# Patient Record
Sex: Male | Born: 1980 | Race: Black or African American | Hispanic: No | Marital: Married | Smoking: Never smoker
Health system: Southern US, Community
[De-identification: ages and names within clinical notes are randomized; demographics above are authoritative.]

---

## 2008-01-26 ENCOUNTER — Ambulatory Visit: Payer: Self-pay | Admitting: Family Medicine

## 2010-04-13 ENCOUNTER — Ambulatory Visit: Payer: Self-pay | Admitting: Family Medicine

## 2011-03-01 ENCOUNTER — Ambulatory Visit: Payer: Self-pay | Admitting: Internal Medicine

## 2012-05-25 ENCOUNTER — Ambulatory Visit: Payer: Self-pay | Admitting: Internal Medicine

## 2012-06-13 ENCOUNTER — Ambulatory Visit: Payer: Self-pay | Admitting: Orthopedic Surgery

## 2013-11-06 IMAGING — CR DG SHOULDER 3+V*L*
1 series · 6 of 6 positions shown · non-contrast
Comparison: none

REASON FOR EXAM: injury
COMMENTS:

PROCEDURE:     MDR - MDR SHOULDER LEFT COMPLETE  - May 25, 2012  [DATE]
RESULT:     Images of the left shoulder demonstrate no definite fracture,
dislocation or radiopaque foreign body.

[Series 1: internal rotate · 0.17mm/px · 6 of 6 slices shown]
[im 1/6]
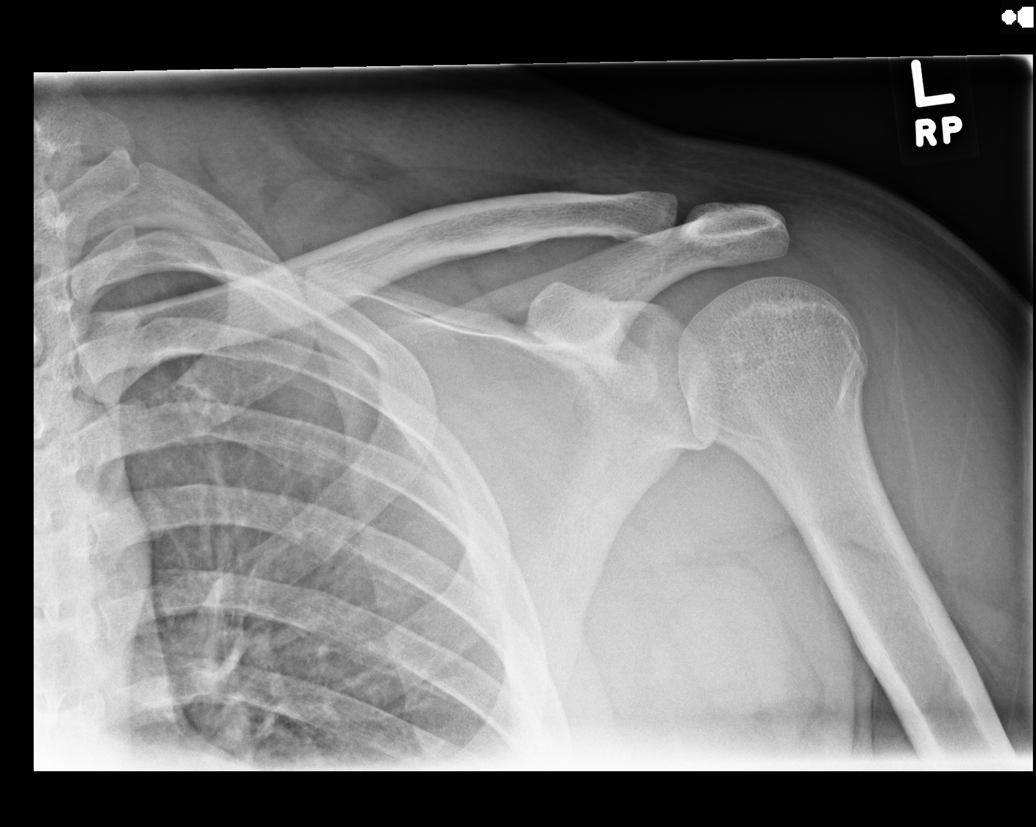
[im 2/6]
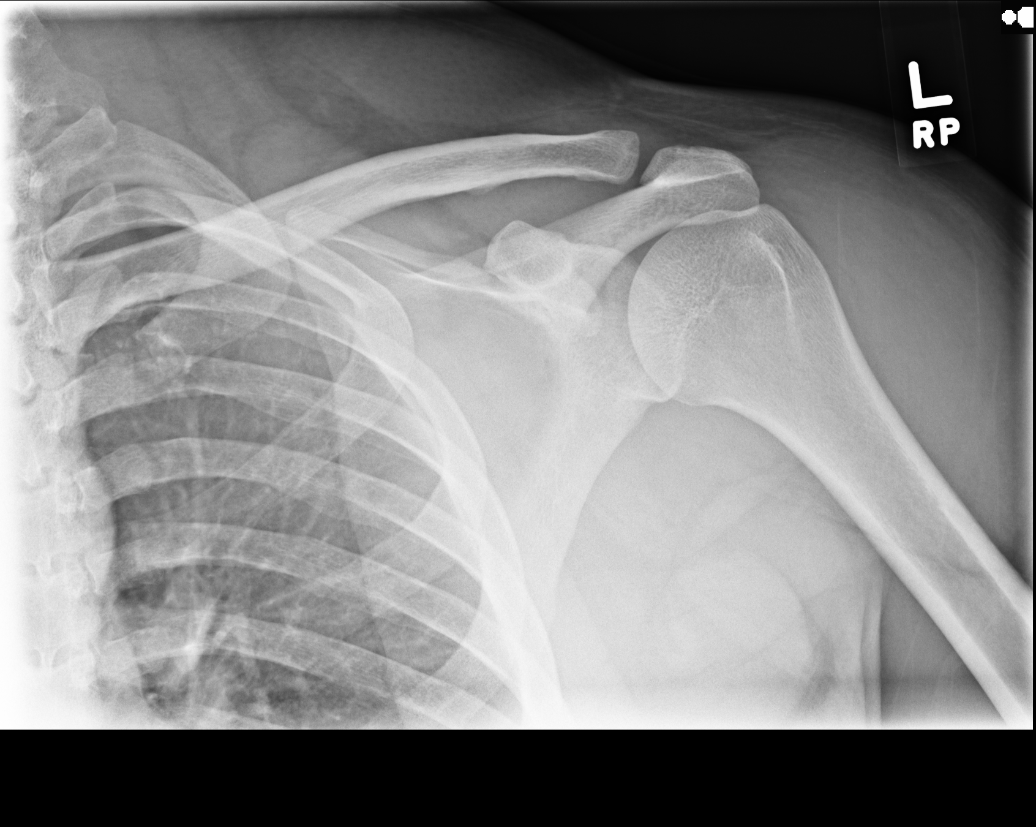
[im 3/6]
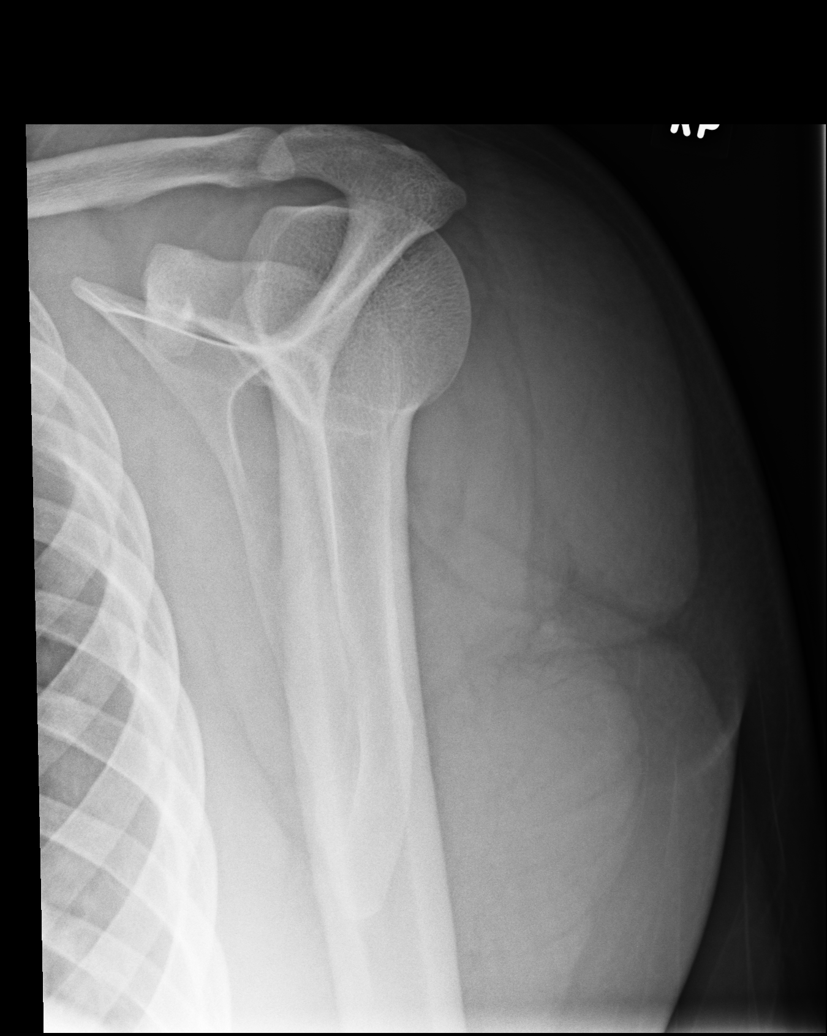
[im 4/6]
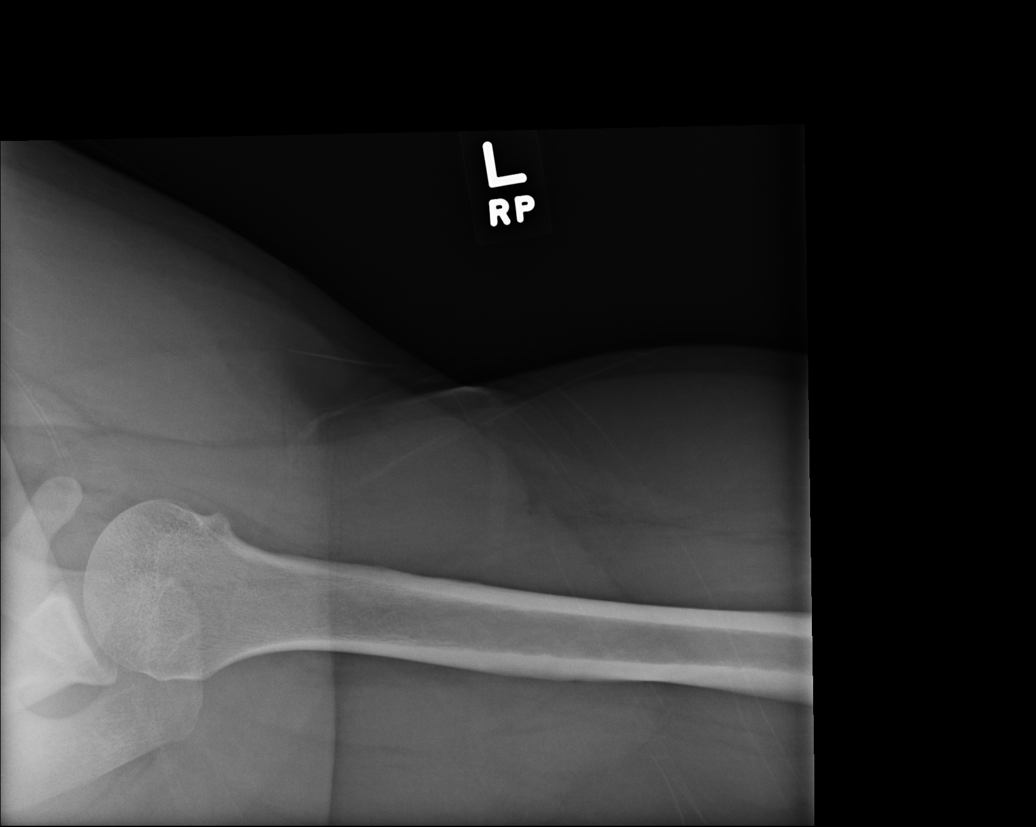
[im 5/6]
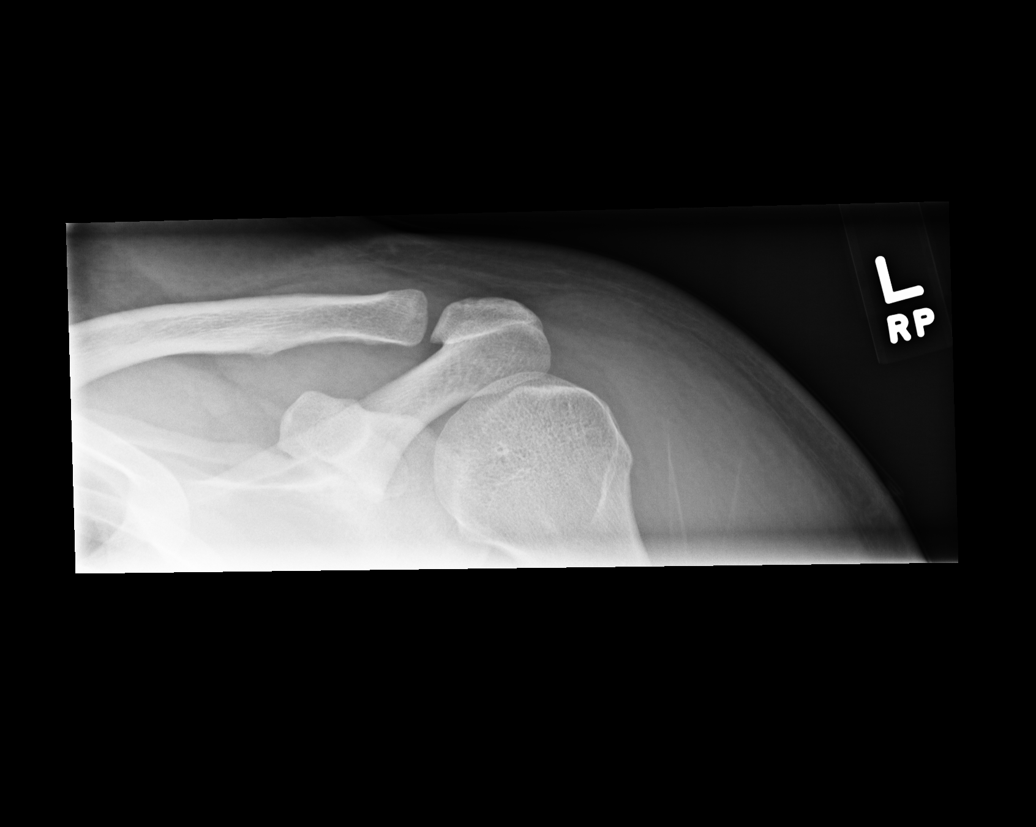
[im 6/6]
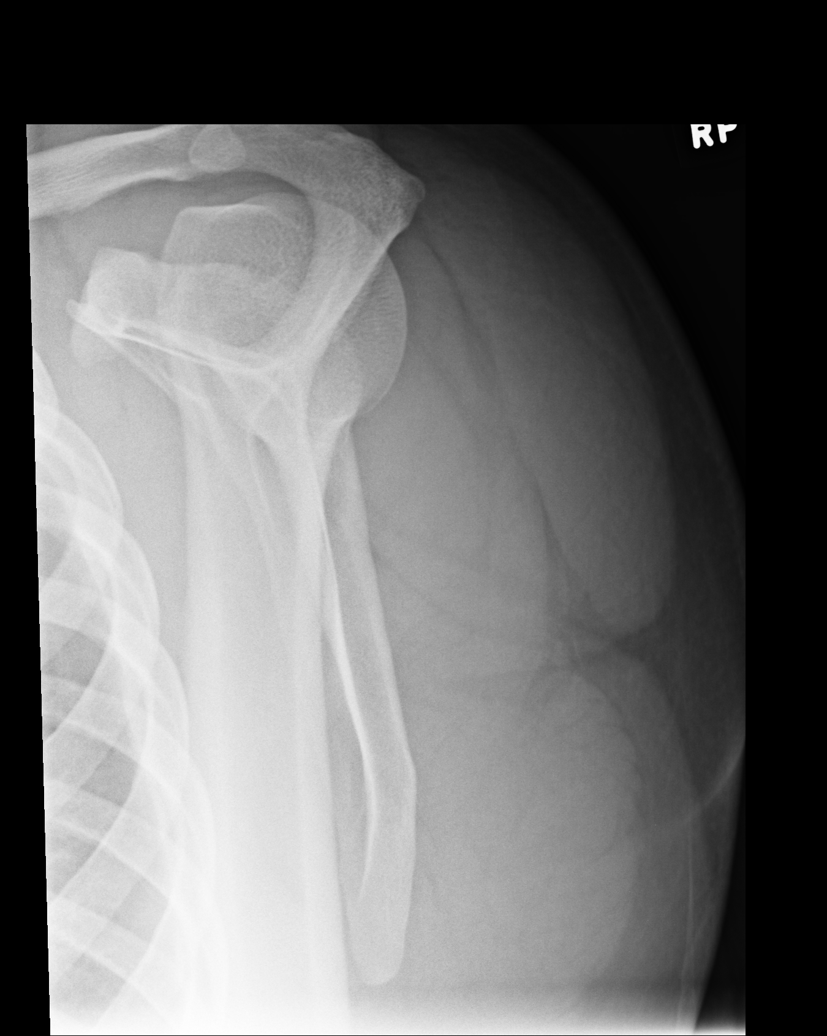

[6 of 6 positions shown; findings below may reference images not displayed]

IMPRESSION: Please see above.

[REDACTED]

## 2014-01-12 ENCOUNTER — Ambulatory Visit: Payer: Self-pay | Admitting: Family Medicine

## 2014-01-12 LAB — CBC WITH DIFFERENTIAL/PLATELET
BASOS PCT: 0.3 %
Basophil #: 0 10*3/uL (ref 0.0–0.1)
EOS PCT: 0.3 %
Eosinophil #: 0 10*3/uL (ref 0.0–0.7)
HCT: 47.5 % (ref 40.0–52.0)
HGB: 16.1 g/dL (ref 13.0–18.0)
LYMPHS PCT: 15.5 %
Lymphocyte #: 0.8 10*3/uL — ABNORMAL LOW (ref 1.0–3.6)
MCH: 28.7 pg (ref 26.0–34.0)
MCHC: 33.8 g/dL (ref 32.0–36.0)
MCV: 85 fL (ref 80–100)
MONOS PCT: 12.4 %
Monocyte #: 0.6 x10 3/mm (ref 0.2–1.0)
Neutrophil #: 3.8 10*3/uL (ref 1.4–6.5)
Neutrophil %: 71.5 %
PLATELETS: 170 10*3/uL (ref 150–440)
RBC: 5.6 10*6/uL (ref 4.40–5.90)
RDW: 14.9 % — ABNORMAL HIGH (ref 11.5–14.5)
WBC: 5.2 10*3/uL (ref 3.8–10.6)

## 2014-01-12 LAB — BASIC METABOLIC PANEL
Anion Gap: 10 (ref 7–16)
BUN: 14 mg/dL (ref 7–18)
CALCIUM: 8.2 mg/dL — AB (ref 8.5–10.1)
Chloride: 99 mmol/L (ref 98–107)
Co2: 28 mmol/L (ref 21–32)
Creatinine: 1.35 mg/dL — ABNORMAL HIGH (ref 0.60–1.30)
EGFR (Non-African Amer.): >60
GLUCOSE: 101 mg/dL — AB (ref 65–99)
Osmolality: 274 (ref 275–301)
Potassium: 3.9 mmol/L (ref 3.5–5.1)
SODIUM: 137 mmol/L (ref 136–145)

## 2018-12-01 ENCOUNTER — Ambulatory Visit
Admission: EM | Admit: 2018-12-01 | Discharge: 2018-12-01 | Disposition: A | Discharge status: Home / Self Care | Payer: BLUE CROSS/BLUE SHIELD | Attending: Family Medicine | Admitting: Family Medicine

## 2018-12-01 ENCOUNTER — Encounter: Payer: Self-pay | Admitting: Emergency Medicine

## 2018-12-01 ENCOUNTER — Other Ambulatory Visit: Payer: Self-pay

## 2018-12-01 DIAGNOSIS — R5383 Other fatigue: Secondary | ICD-10-CM | POA: Diagnosis not present

## 2018-12-01 DIAGNOSIS — K219 Gastro-esophageal reflux disease without esophagitis: Secondary | ICD-10-CM | POA: Diagnosis not present

## 2018-12-01 DIAGNOSIS — J111 Influenza due to unidentified influenza virus with other respiratory manifestations: Secondary | ICD-10-CM

## 2018-12-01 DIAGNOSIS — R05 Cough: Secondary | ICD-10-CM | POA: Diagnosis not present

## 2018-12-01 DIAGNOSIS — R69 Illness, unspecified: Secondary | ICD-10-CM | POA: Insufficient documentation

## 2018-12-01 MED ORDER — HYDROCOD POLST-CPM POLST ER 10-8 MG/5ML PO SUER: 5.0000 mL | Freq: Every evening | ORAL | 0 refills | Status: AC | PRN

## 2018-12-01 MED ORDER — OMEPRAZOLE 40 MG PO CPDR: 40.0000 mg | DELAYED_RELEASE_CAPSULE | Freq: Every day | ORAL | 0 refills | Status: AC

## 2018-12-01 MED ORDER — BENZONATATE 100 MG PO CAPS: 100.0000 mg | ORAL_CAPSULE | Freq: Three times a day (TID) | ORAL | 0 refills | Status: AC | PRN

## 2018-12-01 NOTE — Discharge Instructions (Signed)
Medication as prescribed.  Rest and lots of fluids.  Take care  Dr. Edythe Riches  

## 2018-12-01 NOTE — ED Triage Notes (Signed)
Patient c/o cough, headache and fever that started 6 days ago. Patient has been taking OTC Tylenol for his symptoms.

## 2018-12-01 NOTE — ED Provider Notes (Signed)
[facility]    CSN: [id number] Arrival date & time: 12/01/18  1413  History   Chief Complaint Chief Complaint  Patient presents with  . Fever  . Headache   HPI  37 year old male presents with the above complaints.  Patient reports that he was sick last week.  Developed fever, T-max 104 on Wednesday.  Persisted until Friday.  Then developed fatigue, cough.  Has had some recent stomach cramping as well as reflux as well.  No fever currently.  He has been taking Tylenol without resolution.  Still feels quite fatigued which is his main complaint at this time.  No known exacerbating factors.  No other reported symptoms.  No other complaints.  Home Medications    Prior to Admission medications   Medication Sig Start Date End Date Taking? Authorizing Provider  benzonatate (TESSALON) 100 MG capsule Take 1 capsule (100 mg total) by mouth 3 (three) times daily as needed. 12/01/18   Tommie Samsook, Sanya Kobrin G, DO  chlorpheniramine-HYDROcodone (TUSSIONEX PENNKINETIC ER) 10-8 MG/5ML SUER Take 5 mLs by mouth at bedtime as needed. 12/01/18   Tommie Samsook, Monalisa Bayless G, DO  omeprazole (PRILOSEC) 40 MG capsule Take 1 capsule (40 mg total) by mouth daily for 14 days. 12/01/18 12/15/18  Tommie Samsook, Natesha Hassey G, DO   Social History Social History   Tobacco Use  . Smoking status: Never Smoker  . Smokeless tobacco: Never Used  Substance Use Topics  . Alcohol use: Yes    Comment: socially  . Drug use: Never   Allergies   Patient has no known allergies.   Review of Systems Review of Systems Per HPI  Physical Exam Triage Vital Signs ED Triage Vitals  Enc Vitals Group     BP 12/01/18 1453 130/88     Pulse Rate 12/01/18 1453 87     Resp 12/01/18 1453 18     Temp 12/01/18 1453 98.8 F (37.1 C)     Temp Source 12/01/18 1453 Oral     SpO2 12/01/18 1453 97 %     Weight 12/01/18 1451 265 lb (120.2 kg)     Height 12/01/18 1451 6' (1.829 m)     Head Circumference --      Peak Flow --      Pain Score 12/01/18 1451  0     Pain Loc --      Pain Edu? --      Excl. in GC? --    Updated Vital Signs BP 130/88 (BP Location: Right Arm)   Pulse 87   Temp 98.8 F (37.1 C) (Oral)   Resp 18   Ht 6' (1.829 m)   Wt 120.2 kg   SpO2 97%   BMI 35.94 kg/m   Visual Acuity Right Eye Distance:   Left Eye Distance:   Bilateral Distance:    Right Eye Near:   Left Eye Near:    Bilateral Near:     Physical Exam Vitals signs and nursing note reviewed.  Constitutional:      General: He is not in acute distress. HENT:     Head: Normocephalic and atraumatic.     Right Ear: Tympanic membrane normal.     Left Ear: Tympanic membrane normal.     Mouth/Throat:     Pharynx: Oropharynx is clear. No posterior oropharyngeal erythema.  Cardiovascular:     Rate and Rhythm: Normal rate and regular rhythm.  Pulmonary:     Effort: Pulmonary effort is normal.     Breath sounds:  No wheezing, rhonchi or rales.  Abdominal:     General: There is no distension.     Palpations: Abdomen is soft.     Tenderness: There is no abdominal tenderness.  Neurological:     Mental Status: He is alert.  Psychiatric:        Mood and Affect: Mood normal.        Behavior: Behavior normal.    UC Treatments / Results  Labs (all labs ordered are listed, but only abnormal results are displayed) Labs Reviewed - No data to display  EKG None  Radiology No results found.  Procedures Procedures (including critical care time)  Medications Ordered in UC Medications - No data to display  Initial Impression / Assessment and Plan / UC Course  I have reviewed the triage vital signs and the nursing notes.  Pertinent labs & imaging results that were available during my care of the patient were reviewed by me and considered in my medical decision making (see chart for details).    37 year old male presents with recent influenza versus influenza-like illness.  Now with cough, fatigue, heartburn.  Patient is well-appearing.  I believe  that this is all post influenza and secondary to his underlying diet (in regards to the heartburn).  Tessalon Perles and Tessalon for the cough.  Omeprazole for GERD.  Supportive care.  Work note given.  Final Clinical Impressions(s) / UC Diagnoses   Final diagnoses:  Influenza-like illness  Gastroesophageal reflux disease without esophagitis     Discharge Instructions     Medication as prescribed.  Rest and lots of fluids.  Take care  Dr. Adriana Simasook    ED Prescriptions    Medication Sig Dispense Auth. Provider   benzonatate (TESSALON) 100 MG capsule Take 1 capsule (100 mg total) by mouth 3 (three) times daily as needed. 30 capsule [place], [place] G, DO   chlorpheniramine-HYDROcodone (TUSSIONEX PENNKINETIC ER) 10-8 MG/5ML SUER Take 5 mLs by mouth at bedtime as needed. 60 mL Laysha Childers G, DO   omeprazole (PRILOSEC) 40 MG capsule Take 1 capsule (40 mg total) by mouth daily for 14 days. 14 capsule Tommie Samsook, Dare Spillman G, DO     Controlled Substance Prescriptions [place] Controlled Substance Registry consulted? Not Applicable   Tommie SamsCook, Caylyn Tedeschi G, DO 12/01/18 1658

## 2019-09-03 ENCOUNTER — Ambulatory Visit: Payer: Self-pay
